# Patient Record
Sex: Female | Born: 1960 | Race: White | Hispanic: No | Marital: Married | Smoking: Never smoker
Health system: Southern US, Community
[De-identification: ages and names within clinical notes are randomized; demographics above are authoritative.]

## PROBLEM LIST (undated history)

## (undated) DIAGNOSIS — G43909 Migraine, unspecified, not intractable, without status migrainosus: Secondary | ICD-10-CM

## (undated) DIAGNOSIS — R569 Unspecified convulsions: Secondary | ICD-10-CM

---

## 2016-10-09 ENCOUNTER — Emergency Department: Payer: Managed Care, Other (non HMO)

## 2016-10-09 ENCOUNTER — Ambulatory Visit (INDEPENDENT_AMBULATORY_CARE_PROVIDER_SITE_OTHER)
Admission: EM | Admit: 2016-10-09 | Discharge: 2016-10-09 | Disposition: A | Discharge status: Other Acute Inpatient Hospital | Payer: Managed Care, Other (non HMO) | Source: Home / Self Care | Attending: Emergency Medicine | Admitting: Emergency Medicine

## 2016-10-09 ENCOUNTER — Encounter: Payer: Self-pay | Admitting: Emergency Medicine

## 2016-10-09 ENCOUNTER — Emergency Department
Admission: EM | Admit: 2016-10-09 | Discharge: 2016-10-09 | Disposition: A | Discharge status: Home / Self Care | Payer: Managed Care, Other (non HMO) | Attending: Emergency Medicine | Admitting: Emergency Medicine

## 2016-10-09 DIAGNOSIS — R569 Unspecified convulsions: Secondary | ICD-10-CM

## 2016-10-09 DIAGNOSIS — R202 Paresthesia of skin: Secondary | ICD-10-CM | POA: Diagnosis not present

## 2016-10-09 DIAGNOSIS — R29818 Other symptoms and signs involving the nervous system: Secondary | ICD-10-CM

## 2016-10-09 DIAGNOSIS — R079 Chest pain, unspecified: Secondary | ICD-10-CM

## 2016-10-09 DIAGNOSIS — E871 Hypo-osmolality and hyponatremia: Secondary | ICD-10-CM

## 2016-10-09 DIAGNOSIS — R0789 Other chest pain: Secondary | ICD-10-CM

## 2016-10-09 DIAGNOSIS — R51 Headache: Secondary | ICD-10-CM | POA: Diagnosis present

## 2016-10-09 HISTORY — DX: Migraine, unspecified, not intractable, without status migrainosus: G43.909

## 2016-10-09 HISTORY — DX: Unspecified convulsions: R56.9

## 2016-10-09 LAB — CBC WITH DIFFERENTIAL/PLATELET
BASOS PCT: 1 %
Basophils Absolute: 0.1 10*3/uL (ref 0–0.1)
EOS ABS: 0.1 10*3/uL (ref 0–0.7)
Eosinophils Relative: 2 %
HCT: 40.8 % (ref 35.0–47.0)
Hemoglobin: 14.1 g/dL (ref 12.0–16.0)
Lymphocytes Relative: 27 %
Lymphs Abs: 2 10*3/uL (ref 1.0–3.6)
MCH: 32.3 pg (ref 26.0–34.0)
MCHC: 34.5 g/dL (ref 32.0–36.0)
MCV: 93.7 fL (ref 80.0–100.0)
MONO ABS: 0.7 10*3/uL (ref 0.2–0.9)
MONOS PCT: 10 %
Neutro Abs: 4.4 10*3/uL (ref 1.4–6.5)
Neutrophils Relative %: 60 %
Platelets: 294 10*3/uL (ref 150–440)
RBC: 4.36 MIL/uL (ref 3.80–5.20)
RDW: 12.2 % (ref 11.5–14.5)
WBC: 7.3 10*3/uL (ref 3.6–11.0)

## 2016-10-09 LAB — BASIC METABOLIC PANEL
Anion gap: 10 (ref 5–15)
Anion gap: 7 (ref 5–15)
BUN: 10 mg/dL (ref 6–20)
BUN: 8 mg/dL (ref 6–20)
CALCIUM: 9.2 mg/dL (ref 8.9–10.3)
CALCIUM: 8.6 mg/dL — AB (ref 8.9–10.3)
CO2: 24 mmol/L (ref 22–32)
CO2: 25 mmol/L (ref 22–32)
CREATININE: 0.72 mg/dL (ref 0.44–1.00)
Chloride: 95 mmol/L — ABNORMAL LOW (ref 101–111)
Chloride: 96 mmol/L — ABNORMAL LOW (ref 101–111)
Creatinine, Ser: 0.78 mg/dL (ref 0.44–1.00)
GFR calc Af Amer: >60 mL/min (ref >60)
Glucose, Bld: 101 mg/dL — ABNORMAL HIGH (ref 65–99)
Glucose, Bld: 134 mg/dL — ABNORMAL HIGH (ref 65–99)
Potassium: 3.7 mmol/L (ref 3.5–5.1)
Potassium: 3.4 mmol/L — ABNORMAL LOW (ref 3.5–5.1)
SODIUM: 129 mmol/L — AB (ref 135–145)
SODIUM: 128 mmol/L — AB (ref 135–145)

## 2016-10-09 LAB — TROPONIN I

## 2016-10-09 LAB — GLUCOSE, CAPILLARY: GLUCOSE-CAPILLARY: 100 mg/dL — AB (ref 65–99)

## 2016-10-09 MED ORDER — ASPIRIN 81 MG PO CHEW
324.0000 mg | CHEWABLE_TABLET | Freq: Once | ORAL | Status: DC
Start: 2016-10-09 — End: 2016-10-09
  Filled 2016-10-09: qty 4

## 2016-10-09 MED ORDER — ASPIRIN 81 MG PO CHEW
324.0000 mg | CHEWABLE_TABLET | Freq: Once | ORAL | Status: AC
  Administered 2016-10-09: 324 mg via ORAL

## 2016-10-09 MED ORDER — SODIUM CHLORIDE 0.9 % IV BOLUS (SEPSIS)
1000.0000 mL | Freq: Once | INTRAVENOUS | Status: AC
  Administered 2016-10-09: 1000 mL via INTRAVENOUS

## 2016-10-09 MED ORDER — METOCLOPRAMIDE HCL 5 MG/ML IJ SOLN
10.0000 mg | Freq: Once | INTRAMUSCULAR | Status: AC
  Administered 2016-10-09: 10 mg via INTRAVENOUS
  Filled 2016-10-09: qty 2

## 2016-10-09 MED ORDER — KETOROLAC TROMETHAMINE 30 MG/ML IJ SOLN
15.0000 mg | INTRAMUSCULAR | Status: AC
  Administered 2016-10-09: 15 mg via INTRAVENOUS
  Filled 2016-10-09: qty 1

## 2016-10-09 NOTE — ED Triage Notes (Signed)
Pt is traveling alone and she started having light headedness and tingling in her arms while driving. She says its kind [place].

## 2016-10-09 NOTE — ED Notes (Signed)
Patient transported to X-ray 

## 2016-10-09 NOTE — ED Provider Notes (Addendum)
HPI  SUBJECTIVE:  Casey Rush is a 56 y.o. female who presents with lightheadedness for the past several hours. She reports an "aura" that she describes as a "heaviness in my head, like wearing a helmet" for the past 2-1/2 hours. She states that she feels like she is about to have a seizure although her last seizure was over 20 years ago. She states that she does get auras still. She reports 2 hours of a constant chest heaviness bilaterally described as a weight on her chest and bilateral arm tingling and "heaviness". She tried taking 2 extra Tegretol and Excedrin Migraine without any improvement in her symptoms. There are no aggravating or alleviating factors. She denies nausea, vomiting, diaphoresis, shortness of breath. There is no exertional, positional component to her chest pain. No arm or leg weakness. No blurry vision, headache. No facial droop, dysarthria, discoordination. No burning chest pain but she does report some water brash. No photophobia, phonophobia, abdominal pain, back pain, syncope, seizures. She had less caffeine than usual today. She denies calf swelling, calf pain, hemoptysis, exogenous estrogen, surgery in the past 4 weeks or recent trauma. She [place] from [place] to [place], but is taking frequent rest stops. She has a past medical history of migraines, seizures. No history of hypertension, MI, hypercholesterolemia, GERD, stroke, diabetes, PE, DVT. She is not a smoker. Family history negative for early MIs, stroke.  Past Medical History:  Diagnosis Date  . Migraine   . Seizure [facility](HCC)     History reviewed. No pertinent surgical history.  History reviewed. No pertinent family history.  Social History  Substance Use Topics  . Smoking status: Never Smoker  . Smokeless tobacco: Never Used  . Alcohol use Yes    No current facility-administered medications for this encounter.   Current Outpatient Prescriptions:  .  carbamazepine (TEGRETOL) 200 MG tablet,  Take 200 mg by mouth 3 (three) times daily., Disp: , Rfl:   No Known Allergies   ROS  As noted in HPI.   Physical Exam  BP (!) 191/106 (BP Location: Right Arm)   Pulse 82   Temp 98 F (36.7 C) (Oral)   Resp 18   Ht 6' (1.829 m)   Wt 190 lb (86.2 kg)   SpO2 100%   BMI 25.77 kg/m   Constitutional: Well developed, well nourished, no acute distress Eyes: PERRL, EOMI, conjunctiva normal bilaterally No photophobia  HENT: Normocephalic, atraumatic,mucus membranes moist Respiratory: Clear to auscultation bilaterally, no rales, no wheezing, no rhonchi. Normal chest appearance. No chest wall tenderness Cardiovascular: Normal rate and rhythm, no murmurs, no gallops, no rubs. Peripheral pulses equal upper and lower extremities. GI: Soft, nondistended, normal bowel sounds, nontender, no rebound, no guarding. No palpable masses skin: No rash, skin intact Musculoskeletal: Calves symmetric, nontender, No edema, no tenderness, no deformities Neurologic: Alert & oriented x 3, CN II-XII intact, no motor deficits, upper or lower extremity strength equal 5/5 bilaterally, grip strength 5/5 and equal bilaterally no pronator drift upper or lower extremities, sensation grossly intact Psychiatric: Speech and behavior appropriate   ED Course   Medications  aspirin chewable tablet 324 mg (324 mg Oral Given 10/09/16 1552)    Orders Placed This Encounter  Procedures  . Glucose, capillary    Standing Status:   Standing    Number of Occurrences:   1  . CBG monitoring, ED    Standing Status:   Standing    Number of Occurrences:   1  . EKG 12-Lead  Standing Status:   Standing    Number of Occurrences:   1  . ED EKG    Standing Status:   Standing    Number of Occurrences:   1    Order Specific Question:   Reason for Exam    Answer:   Other (See Comments)   Results for orders placed or performed during the hospital encounter of 10/09/16 (from the past 24 hour(s))  Glucose, capillary      Status: Abnormal   Collection Time: 10/09/16  3:19 PM  Result Value Ref Range   Glucose-Capillary 100 (H) 65 - 99 mg/dL   No results found.  ED Clinical Impression  Chest heaviness  Paresthesias  Aura   ED Assessment/Plan Care everywhere records reviewed. Patient with seen in the [facility] ED for a radial fracture. Blood pressure at the time was 127/77   Repeat vitals: 162/83, heart rate 72, satting 100% on room air. Her glucose is 100. EKG: Normal sinus rhythm, rate 82. Normal axis, normal intervals. No hypertrophy. No ST elevation. Nonspecific ST-T wave abnormalities. Isolated Q in 3. No previous EKG for comparison.  She does not appear to be having a stroke at this point in time. She is completely neurologically intact. Doubt hypertensive emergency as her blood pressure did improve. Concern for atypical chest pain given her history and nonspecific EKG changes. She also may be about have a seizure. Feel that she would benefit from at the very least observation to make sure that she does not have a seizure while driving. Giving 324 mg aspirin here, will transfer [facility] ED via EMS. Discussed case with Gearldine Bienenstock, charge nurse. Discussed MDM, the rationale for transfer with patient. She agrees with plan.  Meds ordered this encounter  Medications  . carbamazepine (TEGRETOL) 200 MG tablet    Sig: Take 200 mg by mouth 3 (three) times daily.  Marland Kitchen aspirin chewable tablet 324 mg    *This clinic note was created using Scientist, clinical (histocompatibility and immunogenetics). Therefore, there may be occasional mistakes despite careful proofreading.  ?   Domenick Gong, MD 10/09/16 1603    Domenick Gong, MD 10/09/16 [phone]

## 2016-10-09 NOTE — ED Provider Notes (Signed)
[facility] Emergency Department Provider Note  ____________________________________________  Time seen: Approximately 6:37 PM  I have reviewed the triage vital signs and the nursing notes.   HISTORY  Chief Complaint Chest Pain    HPI Casey Rush is a 56 y.o. female sent to the ED from urgent care due to chest pain. Patient reports the pain started about 3 hours prior to arrival, around 1:45 PM today. She was driving in her car back home to [place] from [place]. She first felt an aura typical of her migraine headaches and so took extra Tegretol and Excedrin Migraine but this did not relieve her symptoms .  She then started developing some tightness and pressure in her chest. This is nonradiating, no associated nausea vomiting diaphoresis or shortness of breath. No dizziness or syncope. Never had any thing like this before. Attacks. No hypertension diabetes or hyperlipidemia. Nonsmoker. No family history of heart disease. Symptoms of been constant since onset. Remaining relieving factors. Not exertional, not pleuritic. Not positional.     Past Medical History:  Diagnosis Date  . Migraine   . Seizure (HCC)      There are no active problems to display for this patient.    History reviewed. No pertinent surgical history.   Prior to Admission medications   Medication Sig Start Date End Date Taking? Authorizing Provider  carbamazepine (TEGRETOL) 200 MG tablet Take 200 mg by mouth 3 (three) times daily.    Historical Provider, MD     Allergies Patient has no known allergies.   History reviewed. No pertinent family history.  Social History Social History  Substance Use Topics  . Smoking status: Never Smoker  . Smokeless tobacco: Never Used  . Alcohol use Yes    Review of Systems  Constitutional:   No fever or chills.  ENT:   No sore throat. No rhinorrhea. Cardiovascular:   Positive as above chest pain. Respiratory:   No dyspnea or  cough. Gastrointestinal:   Negative for abdominal pain, vomiting and diarrhea.  Genitourinary:   Negative for dysuria or difficulty urinating. Musculoskeletal:   Negative for focal pain or swelling Neurological:   Negative for headaches 10-point ROS otherwise negative.  ____________________________________________   PHYSICAL EXAM:  VITAL SIGNS: ED Triage Vitals  Enc Vitals Group     BP 10/09/16 1644 (!) 157/80     Pulse Rate 10/09/16 1644 75     Resp 10/09/16 1644 17     Temp 10/09/16 1644 98.9 F (37.2 C)     Temp Source 10/09/16 1644 Oral     SpO2 10/09/16 1644 100 %     Weight 10/09/16 1648 190 lb (86.2 kg)     Height 10/09/16 1648 6' (1.829 m)     Head Circumference --      Peak Flow --      Pain Score 10/09/16 1645 0     Pain Loc --      Pain Edu? --      Excl. in GC? --     Vital signs reviewed, nursing assessments reviewed.   Constitutional:   Alert and oriented. Well appearing and in no distress. Eyes:   No scleral icterus. No conjunctival pallor. PERRL. EOMI.  No nystagmus. ENT   Head:   Normocephalic and atraumatic.   Nose:   No congestion/rhinnorhea. No septal hematoma   Mouth/Throat:   MMM, no pharyngeal erythema. No peritonsillar mass.    Neck:   No stridor. No SubQ emphysema. No meningismus.  Hematological/Lymphatic/Immunilogical:   No cervical lymphadenopathy. Cardiovascular:   RRR. Symmetric bilateral radial and DP pulses.  No murmurs.  Respiratory:   Normal respiratory effort without tachypnea nor retractions. Breath sounds are clear and equal bilaterally. No wheezes/rales/rhonchi. Chest wall nontender Gastrointestinal:   Soft and nontender. Non distended. There is no CVA tenderness.  No rebound, rigidity, or guarding. Genitourinary:   deferred Musculoskeletal:   Nontender with normal range of motion in all extremities. No joint effusions.  No lower extremity tenderness.  No edema. Neurologic:   Normal speech and language.  CN 2-10  normal. Motor grossly intact. No gross focal neurologic deficits are appreciated.  Skin:    Skin is warm, dry and intact. No rash noted.  No petechiae, purpura, or bullae.  ____________________________________________    LABS (pertinent positives/negatives) (all labs ordered are listed, but only abnormal results are displayed) Labs Reviewed  BASIC METABOLIC PANEL - Abnormal; Notable for the following:       Result Value   Sodium 129 (*)    Chloride 95 (*)    Glucose, Bld 101 (*)    All other components within normal limits  BASIC METABOLIC PANEL - Abnormal; Notable for the following:    Sodium 128 (*)    Potassium 3.4 (*)    Chloride 96 (*)    Glucose, Bld 134 (*)    Calcium 8.6 (*)    All other components within normal limits  TROPONIN I  CBC WITH DIFFERENTIAL/PLATELET  TROPONIN I   ____________________________________________   EKG  Interpreted by me Normal sinus rhythm rate of 77, normal axis and intervals. Normal QRS ST segments and T waves.  ____________________________________________    RADIOLOGY  Chest x-ray unremarkable  ____________________________________________   PROCEDURES Procedures  ____________________________________________   INITIAL IMPRESSION / ASSESSMENT AND PLAN / ED COURSE  Pertinent labs & imaging results that were available during my care of the patient were reviewed by me and considered in my medical decision making (see chart for details).  Patient well appearing no distress, presents with nonspecific chest pain.Considering the patient's symptoms, medical history, and physical examination today, I have low suspicion for ACS, PE, TAD, pneumothorax, carditis, mediastinitis, pneumonia, CHF, or sepsis.  Heart score is low risk. Initial labs do show a borderline low sodium level of 129. It's difficult to see how this would correspond with the symptoms that she is currently having, give IV fluids to improve this, and recheck her  chemistry and troponin for further risk stratification. In the meantime patient was given 324 of aspirin at urgent care, and will give Reglan and Toradol for her possible migraine related symptoms. Anticipate the patient will be suitable for discharge home and follow-up with primary care given her lack of risk factors and atypical presentation if follow-up labs do not reveal any concerning findings.    ----------------------------------------- 8:05 PM on 10/09/2016 -----------------------------------------  Repeat lab stable, second troponin negative. Patient is feeling better, tolerating oral intake. Counseled on increasing her salt intake as she normally follows a low salt diet and drink lots of water. This may have contributed to her symptoms although they are atypical for hyponatremia as well. I think the patient is at a very low risk for any significant neurologic complication and this hyponatremia is going to be self-limited if she increases her salt intake. I don't think she is having cerebral edema, I don't think she is at risk for seizure. I don't think that her constellation of symptoms is related to an infectious syndrome  such as Legionella.   Clinical Course    ____________________________________________   FINAL CLINICAL IMPRESSION(S) / ED DIAGNOSES  Final diagnoses:  Nonspecific chest pain  Hyponatremia      New Prescriptions   No medications on file     Portions of this note were generated with dragon dictation software. Dictation errors may occur despite best attempts at proofreading.    Sharman CheekPhillip Swannie Milius, MD 10/09/16 2007

## 2016-10-09 NOTE — ED Triage Notes (Signed)
Pt to ED by EMS from Urgent Care. [facility] and felt as if she was going to have a seizure. Pt has hx of seizures and took her medication without relief.  Pt continued to have chest pressure and a feeling of heaviness with breathing. Pt sent to ED by Urgent Care.

## 2018-09-15 IMAGING — CR DG CHEST 2V
1 series · 2 of 2 positions shown · non-contrast
Comparison: None

CLINICAL DATA: Arm tingling and central chest pressure

EXAM:
CHEST  2 VIEW

[Series 1: dg chest 2 view · 0.14mm/px · 2 of 2 slices shown]
[im 1/2]
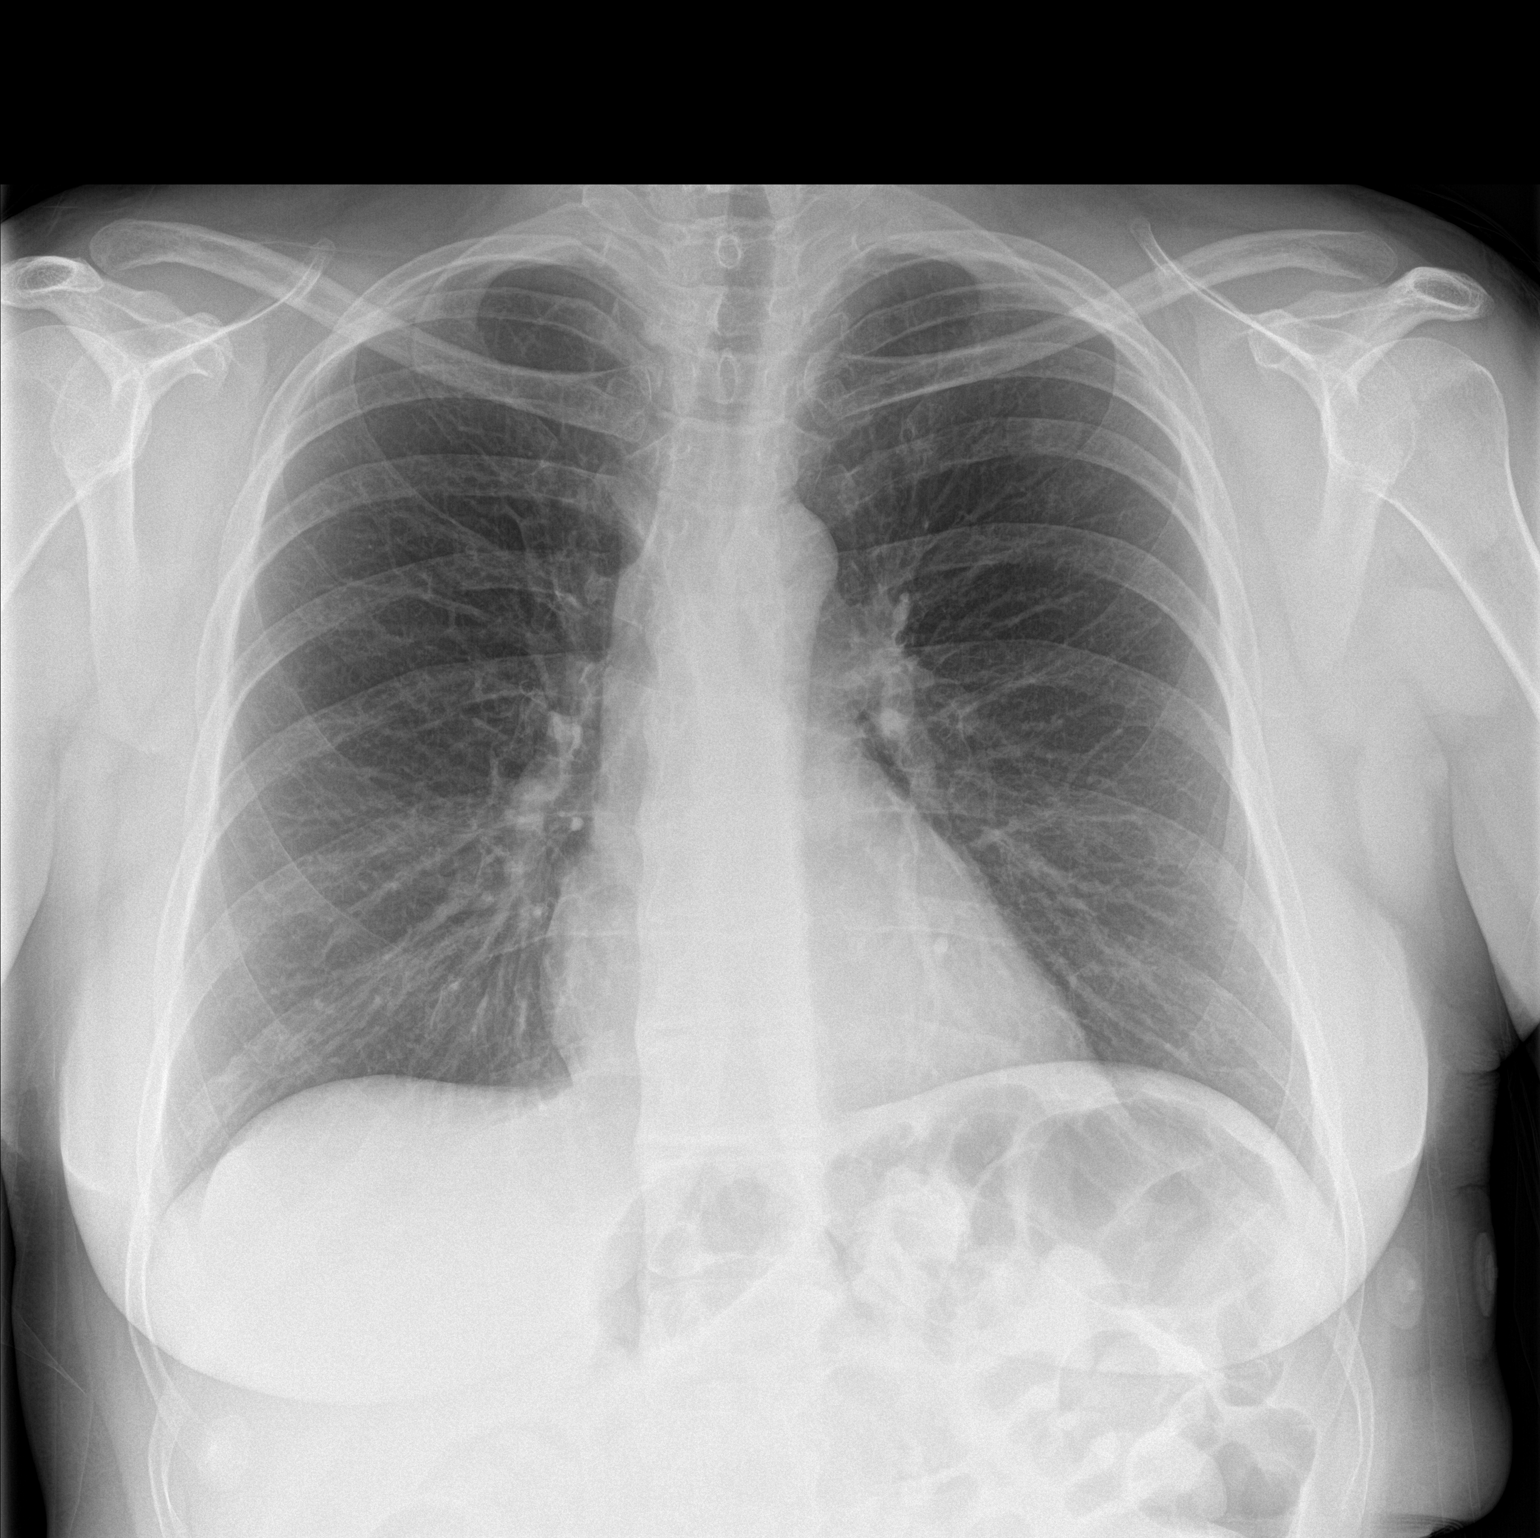
[im 2/2]
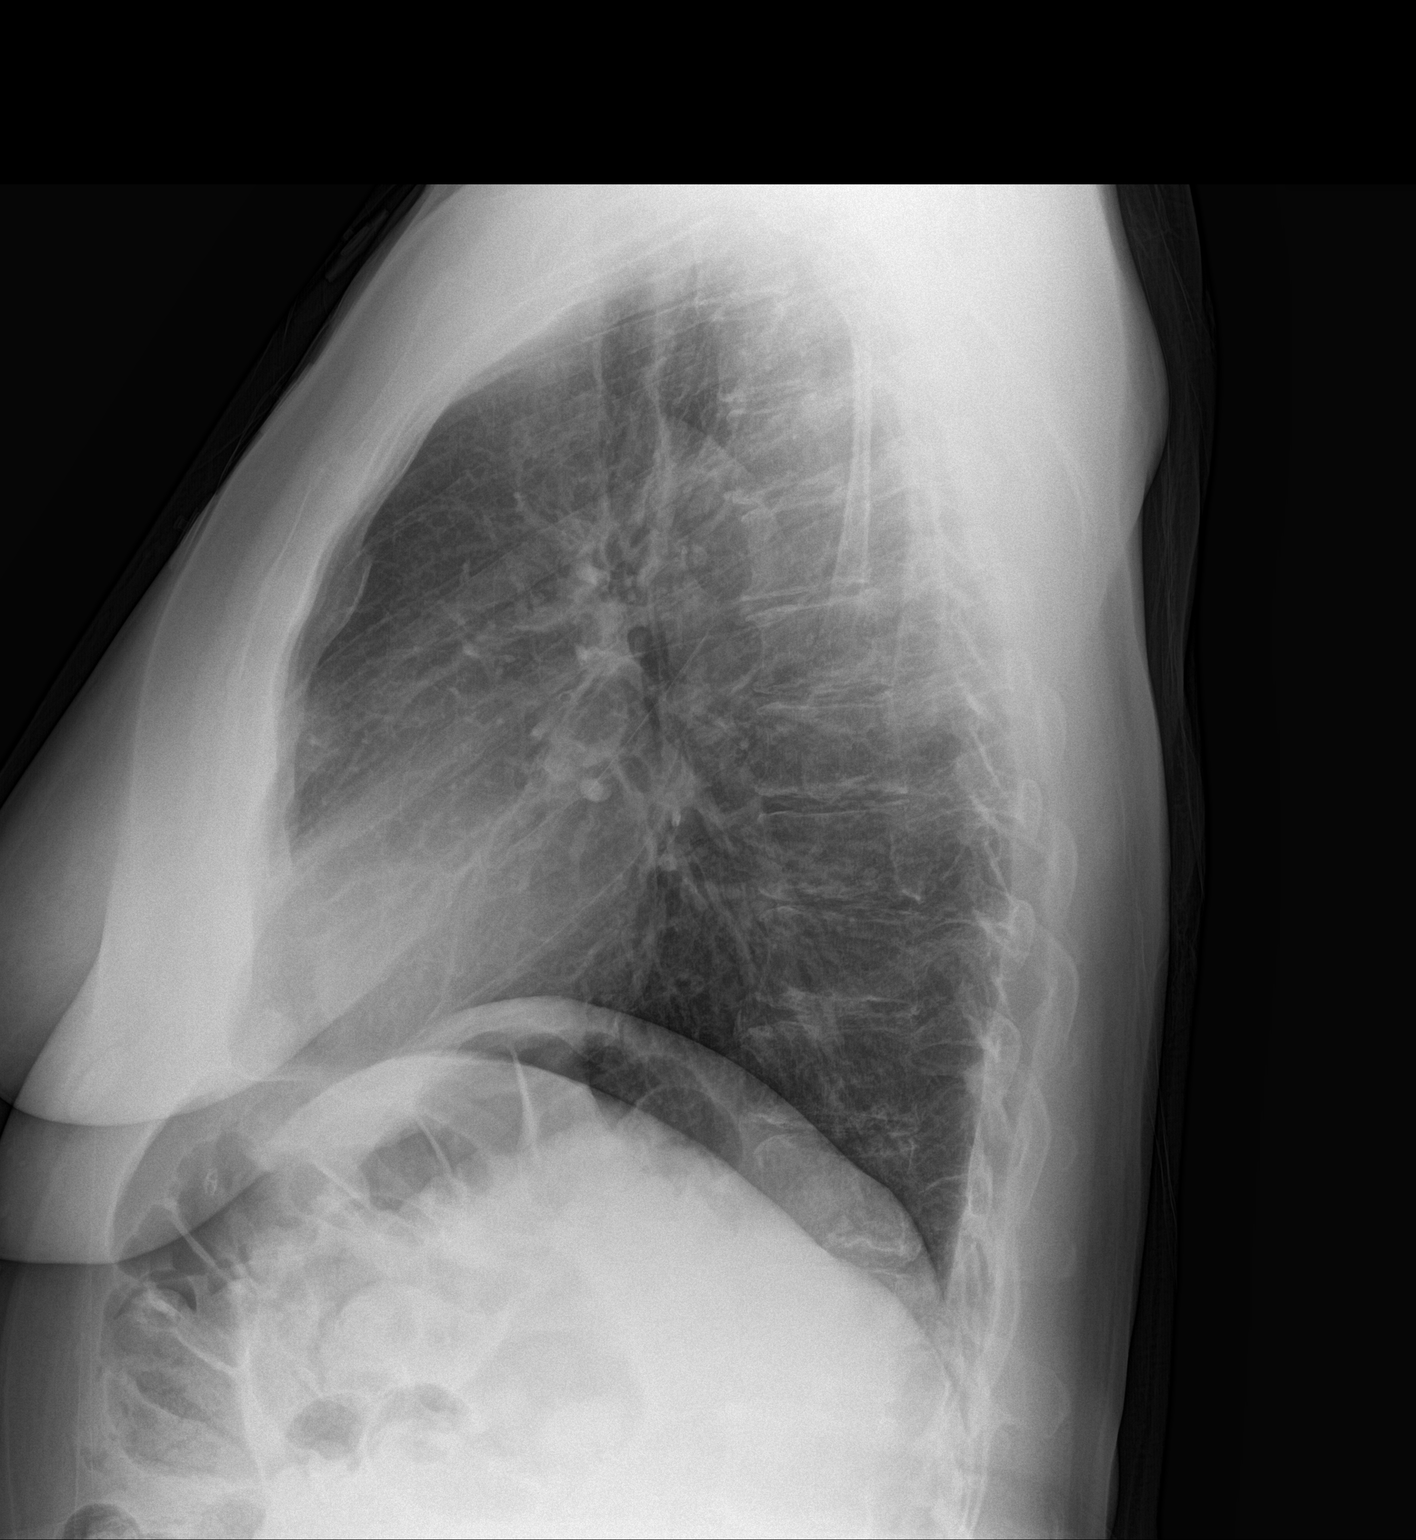

[2 of 2 positions shown; findings below may reference images not displayed]

FINDINGS: Normal heart size, mediastinal contours, and pulmonary vascularity.

Mild peribronchial thickening.

No pulmonary infiltrate, pleural effusion, or pneumothorax.

Bones demineralized.
IMPRESSION: Mild bronchitic changes without infiltrate.
